# Patient Record
Sex: Female | Born: 1985 | Race: Black or African American | Hispanic: No | Marital: Single | Smoking: Never smoker
Health system: Southern US, Community
[De-identification: ages and names within clinical notes are randomized; demographics above are authoritative.]

## PROBLEM LIST (undated history)

## (undated) DIAGNOSIS — J45909 Unspecified asthma, uncomplicated: Secondary | ICD-10-CM

## (undated) NOTE — ED Provider Notes (Signed)
 Formatting of this note is different from the original. eMERGENCY dEPARTMENT eNCOUnter    CHIEF COMPLAINT   Chief Complaint  Patient presents with  ? Vaginal Bleeding    requesting HCG levels to be checked; [redacted] weeks pregnant, PT states was seen at [facility] initially for vaginal bleeding last Thursday.  Wants to know if she is still pregnant,    HPI   Diana Jennings is a 34 y.o. female who presents to the Emergency Department History of pregnancy and has been having vaginal bleeding since last Thursday. Patient was seen and no vomit several times for the same and was told that she needed a D and C but she just felt uncomfortable receiving this procedure because it had not been explained to her very well what was going on. She reports bleeding, heavier over the week with some clots. Her last period was about 11 weeks ago.  PAST MEDICAL HISTORY   Past Medical History:  Diagnosis Date  ? Asthma    illness related   SURGICAL HISTORY   Past Surgical History:  Procedure Laterality Date  ? MOUTH SURGERY     CURRENT MEDICATIONS   No current facility-administered medications for this encounter.    No current outpatient prescriptions on file.   ALLERGIES   Allergies  Allergen Reactions  ? Benadryl [Diphenhydramine Hcl] Shortness Of Breath    Asthma flares   FAMILY HISTORY   No family history on file.  SOCIAL HISTORY   Social History   Social History  ? Marital status: Single    Spouse name: N/A  ? Number of children: N/A  ? Years of education: N/A   Social History Main Topics  ? Smoking status: Never Smoker  ? Smokeless tobacco: Not on file  ? Alcohol use No  ? Drug use: No  ? Sexual activity: Not on file   Other Topics Concern  ? Not on file   Social History Narrative  ? No narrative on file   REVIEW OF SYSTEMS   Constitutional:  Denies fever, no chills, no weakness.   Eyes:  No visual complaints HENT:  Denies sore throat, no ear pain.   Respiratory:  Denies  cough, no shortness of breath.   Cardiovascular:  Denies chest pain, no palpitations GI:  Denies abdominal pain, no nausea, no vomiting, no diarrhea.   GU: Denies any urinary complaints; vaginal bleeding Musculoskeletal:  Denies pain or swelling Back:  Denies pain.   Neck: Denies pain Skin:  Denies rash.   Neurologic:  Denies headache, no focal weakness  See HPI for further details. A complete review of systems has been obtained and is included in the HPI as above and as per nursing notes. 10 systems have been reviewed and are otherwise negative.  PHYSICAL EXAM    VITAL SIGNS: BP 127/74 (BP Location: Left upper arm, Patient Position: Supine)   Pulse 78   Temp 98.6 F (37 C) (Oral)   Resp 18   Ht 5' 3 (1.6 m)   Wt 180 lb (81.6 kg)   LMP 06/12/2016   SpO2 100%   BMI 31.89 kg/m  Constitutional:  Well appearing no distress Head : No appearance of trauma, no raccoon or Battle sign HENT:  Grossly normal with patent airway, no rhinorrhea Eyes:  PERRL, EOMI, conjunctiva normal, no discharge. Neck: Supple painless ROM, trachea midline;  Respiratory:  breath sounds normal, no resp distress Cardiovascular:  heart sounds nl, regular rate and rhythm. Back:  Non tender, normal painless  ROM         GI:   Soft, non-tender, no distention, normal active bowel sounds Musculoskeletal:  atraumatic, no pedal edema, nml ROM, nml color/temp Skin:  intact, warm dry, no rash Neurologic:  alert and oriented x 3, CN?s nml as tested, sensation nml, motor nml, mood/affect nml  RADIOLOGY   Reviewed by me. See official radiology interpretation. Ultrasound Ob < 14 Weeks Single Or First Gest  Result Date: 07/06/2016 FIRST TRIMESTER OBSTETRICAL ULTRASOUND: COMPARISON: TECHNIQUE:  Standard transabdominal imaging was performed as per the standard pelvic imaging protocol. FINDINGS: Uterus measures 7.6 x 4.9 x 4.1 cm There is no intrauterine gestational sac. The endometrial stripe measures 0.8 cm Right ovary  measures 3.4 x 2.2 x 1.1 cm Left ovary measures 3.0 x 2.2 x 1.7 cm. Normal ovarian blood flow bilaterally 1.7 x 1.5 cm cyst left ovary Within the left adnexa adjacent to the ovary there is a 2.0 x 1.8 cm mass. No well-defined gestational sac.   IMPRESSION: NO INTRAUTERINE GESTATIONAL SAC 2.0 X 1.8 CM MASS ADJACENT TO THE LEFT OVARY WITHOUT A WELL-DEFINED GESTATIONAL SAC. THIS COULD REPRESENT A TUBAL ECTOPIC. CONTINUED FOLLOW-UP IS NEEDED Dictated By: Ozell Perry, MD 07/06/2016 7:47 PM  Electronically Signed by: Ozell Perry, MD 07/06/2016 7:51 PM  Labs Reviewed  HCG, BETA, UR, QUAL - Abnormal; Notable for the following:       Result Value   Pregnancy Urine Positive (*)    All other components within normal limits  URINALYSIS, REFLEX WITH SCOPE - Abnormal; Notable for the following:    Blood, UA Moderate (*)    Urine Urobilinogen 2.0 (*)    Bacteria Rare (*)    Mucus Rare (*)    All other components within normal limits   Narrative:    Microscopic indicated based upon the Positive UA results.  HUMAN CHORIONIC GONADOTROPIN, QUANTITATIVE - Abnormal; Notable for the following:    HCGB, Quantitative 487.0 (*)    All other components within normal limits  ABO GROUPING AND RH TYPING  ABO GROUPING AND RH TYPING   ED COURSE & MEDICAL DECISION MAKING   Pertinent labs & imaging studies reviewed. (See chart for details) Patient with possible ectopic pregnancy. I will talk to her OB/GYN regarding her care.  I did discuss with OB/GYN and they agreed to evaluate this patient however the patient became impatient and at one point decided she was going to leave AGAINST MEDICAL ADVICE. She stated that she had to work in the morning and she could not be her all day. I talked to her about our concerns stating that at ectopic pregnancy could potentially be dangerous and cause internal bleeding and death but she did not want to stay. I will note I reviewed extensive records regarding this patient with her  prior evaluations at [facility] prior ultrasounds noting no prior ovarian masses with the same pregnancy. I also note that they never saw anything to suggest an intrauterine pregnancy either. Her Quant today is the 10th of what it had been before.  At the time of discharge she left with the understanding she is to follow-up with OB/GYN and I did tell her the symptoms of concern that I would have including lightheadedness weakness to suggest internal bleeding.  FINAL IMPRESSION   Final diagnoses:  Left tubal pregnancy without intrauterine pregnancy   Portions of this note may be dictated using Dragon Naturally Speaking voice recognition software. Variances in spelling and vocabulary are possible and unintentional. Not all errors  are caught/corrected. Please notify the dino if any discrepancies are noted or if the meaning of any statement is not clear.    Todd C. Juli, MD 07/07/16 [id number]  Electronically signed by Krystal BROCKS. Juli, MD at 07/07/2016  1:18 AM EST

---

## 2013-12-23 ENCOUNTER — Emergency Department (HOSPITAL_COMMUNITY)
Admission: EM | Admit: 2013-12-23 | Discharge: 2013-12-23 | Discharge status: Against Medical Advice | Payer: Self-pay | Attending: Emergency Medicine | Admitting: Emergency Medicine

## 2013-12-23 ENCOUNTER — Emergency Department (HOSPITAL_COMMUNITY): Payer: Self-pay

## 2013-12-23 ENCOUNTER — Encounter (HOSPITAL_COMMUNITY): Payer: Self-pay | Admitting: Emergency Medicine

## 2013-12-23 DIAGNOSIS — R55 Syncope and collapse: Secondary | ICD-10-CM | POA: Insufficient documentation

## 2013-12-23 DIAGNOSIS — J45909 Unspecified asthma, uncomplicated: Secondary | ICD-10-CM | POA: Insufficient documentation

## 2013-12-23 DIAGNOSIS — R519 Headache, unspecified: Secondary | ICD-10-CM

## 2013-12-23 DIAGNOSIS — R51 Headache: Secondary | ICD-10-CM | POA: Insufficient documentation

## 2013-12-23 DIAGNOSIS — H538 Other visual disturbances: Secondary | ICD-10-CM | POA: Insufficient documentation

## 2013-12-23 HISTORY — DX: Unspecified asthma, uncomplicated: J45.909

## 2013-12-23 LAB — CBC WITH DIFFERENTIAL/PLATELET
BASOS ABS: 0 10*3/uL (ref 0.0–0.1)
BASOS PCT: 0 % (ref 0–1)
EOS ABS: 0.1 10*3/uL (ref 0.0–0.7)
Eosinophils Relative: 2 % (ref 0–5)
HEMATOCRIT: 41.1 % (ref 36.0–46.0)
HEMOGLOBIN: 14.4 g/dL (ref 12.0–15.0)
Lymphocytes Relative: 36 % (ref 12–46)
Lymphs Abs: 2 10*3/uL (ref 0.7–4.0)
MCH: 35.1 pg — AB (ref 26.0–34.0)
MCHC: 35 g/dL (ref 30.0–36.0)
MCV: 100.2 fL — ABNORMAL HIGH (ref 78.0–100.0)
MONO ABS: 0.4 10*3/uL (ref 0.1–1.0)
MONOS PCT: 8 % (ref 3–12)
Neutro Abs: 3.1 10*3/uL (ref 1.7–7.7)
Neutrophils Relative %: 54 % (ref 43–77)
Platelets: 334 10*3/uL (ref 150–400)
RBC: 4.1 MIL/uL (ref 3.87–5.11)
RDW: 12.1 % (ref 11.5–15.5)
WBC: 5.6 10*3/uL (ref 4.0–10.5)

## 2013-12-23 LAB — COMPREHENSIVE METABOLIC PANEL
ALBUMIN: 4.3 g/dL (ref 3.5–5.2)
ALT: 15 U/L (ref 0–35)
AST: 22 U/L (ref 0–37)
Alkaline Phosphatase: 50 U/L (ref 39–117)
BUN: 5 mg/dL — AB (ref 6–23)
CO2: 25 mEq/L (ref 19–32)
CREATININE: 0.72 mg/dL (ref 0.50–1.10)
Calcium: 9.5 mg/dL (ref 8.4–10.5)
Chloride: 101 mEq/L (ref 96–112)
GFR calc Af Amer: >90 mL/min (ref >90)
GFR calc non Af Amer: >90 mL/min (ref >90)
Glucose, Bld: 90 mg/dL (ref 70–99)
Potassium: 3.7 mEq/L (ref 3.7–5.3)
Sodium: 141 mEq/L (ref 137–147)
TOTAL PROTEIN: 7.2 g/dL (ref 6.0–8.3)
Total Bilirubin: 0.5 mg/dL (ref 0.3–1.2)

## 2013-12-23 LAB — URINALYSIS, ROUTINE W REFLEX MICROSCOPIC
Bilirubin Urine: NEGATIVE
GLUCOSE, UA: NEGATIVE mg/dL
Hgb urine dipstick: NEGATIVE
Ketones, ur: >80 mg/dL — AB
Leukocytes, UA: NEGATIVE
Nitrite: POSITIVE — AB
PH: 6.5 (ref 5.0–8.0)
Protein, ur: NEGATIVE mg/dL
Specific Gravity, Urine: 1.017 (ref 1.005–1.030)
Urobilinogen, UA: 1 mg/dL (ref 0.0–1.0)

## 2013-12-23 LAB — URINE MICROSCOPIC-ADD ON

## 2013-12-23 LAB — PREGNANCY, URINE: PREG TEST UR: NEGATIVE

## 2013-12-23 MED ORDER — SODIUM CHLORIDE 0.9 % IV BOLUS (SEPSIS)
1000.0000 mL | Freq: Once | INTRAVENOUS | Status: AC
  Administered 2013-12-23: 1000 mL via INTRAVENOUS

## 2013-12-23 MED ORDER — PROCHLORPERAZINE EDISYLATE 5 MG/ML IJ SOLN
10.0000 mg | Freq: Once | INTRAMUSCULAR | Status: AC
  Administered 2013-12-23: 10 mg via INTRAVENOUS
  Filled 2013-12-23: qty 2

## 2013-12-23 MED ORDER — DEXTROSE 5 % IV SOLN
1.0000 g | Freq: Once | INTRAVENOUS | Status: DC
  Filled 2013-12-23: qty 10

## 2013-12-23 NOTE — ED Notes (Signed)
Pt c/o blurred vision that started yesterday and then today "I woke up on the floor". Pt denies knowing why or how she got on the floor. Pt states that she always has headaches.

## 2013-12-23 NOTE — ED Notes (Signed)
Patient transported to CT 

## 2013-12-23 NOTE — ED Notes (Signed)
Pt had orders for IV Rocephin. Pt did not receive medication because she left AMA.
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
## 2013-12-23 NOTE — ED Notes (Signed)
Pt is aware of the need for urine, however states that she is unable to provide one at this time.  
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
## 2013-12-24 NOTE — ED Provider Notes (Signed)
Medical screening examination/treatment/procedure(s) were performed by non-physician practitioner and as supervising physician I was immediately available for consultation/collaboration.   EKG Interpretation None        Rolland Porter, MD 12/24/13 1555

## 2014-01-21 ENCOUNTER — Encounter (HOSPITAL_COMMUNITY): Payer: Self-pay | Admitting: Emergency Medicine

## 2014-01-21 ENCOUNTER — Emergency Department (HOSPITAL_COMMUNITY)
Admission: EM | Admit: 2014-01-21 | Discharge: 2014-01-21 | Disposition: A | Discharge status: Home / Self Care | Payer: Self-pay | Attending: Emergency Medicine | Admitting: Emergency Medicine

## 2014-01-21 DIAGNOSIS — N898 Other specified noninflammatory disorders of vagina: Secondary | ICD-10-CM | POA: Insufficient documentation

## 2014-01-21 DIAGNOSIS — Z3202 Encounter for pregnancy test, result negative: Secondary | ICD-10-CM | POA: Insufficient documentation

## 2014-01-21 DIAGNOSIS — J45909 Unspecified asthma, uncomplicated: Secondary | ICD-10-CM | POA: Insufficient documentation

## 2014-01-21 DIAGNOSIS — G8929 Other chronic pain: Secondary | ICD-10-CM | POA: Insufficient documentation

## 2014-01-21 LAB — URINALYSIS, ROUTINE W REFLEX MICROSCOPIC
Bilirubin Urine: NEGATIVE
Glucose, UA: NEGATIVE mg/dL
HGB URINE DIPSTICK: NEGATIVE
KETONES UR: NEGATIVE mg/dL
Nitrite: NEGATIVE
PH: 5.5 (ref 5.0–8.0)
Protein, ur: NEGATIVE mg/dL
Specific Gravity, Urine: 1.021 (ref 1.005–1.030)
Urobilinogen, UA: 1 mg/dL (ref 0.0–1.0)

## 2014-01-21 LAB — POC URINE PREG, ED: Preg Test, Ur: NEGATIVE

## 2014-01-21 LAB — URINE MICROSCOPIC-ADD ON

## 2014-01-21 LAB — WET PREP, GENITAL
Clue Cells Wet Prep HPF POC: NONE SEEN
TRICH WET PREP: NONE SEEN
YEAST WET PREP: NONE SEEN

## 2014-01-21 MED ORDER — CEFTRIAXONE SODIUM 250 MG IJ SOLR
250.0000 mg | Freq: Once | INTRAMUSCULAR | Status: AC
  Administered 2014-01-21: 250 mg via INTRAMUSCULAR
  Filled 2014-01-21: qty 250

## 2014-01-21 MED ORDER — IBUPROFEN 800 MG PO TABS: 800.0000 mg | ORAL_TABLET | Freq: Three times a day (TID) | ORAL | Status: DC

## 2014-01-21 MED ORDER — LIDOCAINE HCL 1 % IJ SOLN
INTRAMUSCULAR | Status: AC
  Administered 2014-01-21: 20 mL
  Filled 2014-01-21: qty 20

## 2014-01-21 MED ORDER — IBUPROFEN 800 MG PO TABS
800.0000 mg | ORAL_TABLET | Freq: Once | ORAL | Status: AC
  Administered 2014-01-21: 800 mg via ORAL
  Filled 2014-01-21: qty 1

## 2014-01-21 MED ORDER — AZITHROMYCIN 250 MG PO TABS
1000.0000 mg | ORAL_TABLET | Freq: Once | ORAL | Status: AC
  Administered 2014-01-21: 1000 mg via ORAL
  Filled 2014-01-21: qty 4

## 2014-01-21 NOTE — Discharge Instructions (Signed)
FOLLOW UP WITH YOUR DOCTOR FOR FURTHER EVALUATION IF PAIN CONTINUES. RETURN HERE IF SYMPTOMS WORSEN.

## 2014-01-21 NOTE — ED Provider Notes (Signed)
CSN: [id number]     Arrival date & time 01/21/14  0213 History   First MD Initiated Contact with Patient 01/21/14 0600     Chief Complaint  Patient presents with  . Vaginal Pain     (Consider location/radiation/quality/duration/timing/severity/associated sxs/prior Treatment) Patient is a 28 y.o. female presenting with vaginal pain. The history is provided by the patient. No language interpreter was used.  Vaginal Pain Pertinent negatives include no chills or fever. Associated symptoms comments: She complains of left pelvic pain for the past 3 days. She reports a history of chronic pelvic pain that has been evaluated by her gynecologist, she states, by ultrasound and lab studies without answer. She currently has vaginal discharge without fever, nausea or vomiting. No dysuria, abnormal vaginal bleeding, or change in bowel habits..    Past Medical History  Diagnosis Date  . Asthma    History reviewed. No pertinent past surgical history. History reviewed. No pertinent family history. History  Substance Use Topics  . Smoking status: Never Smoker   . Smokeless tobacco: Never Used  . Alcohol Use: Yes     Comment: occ   OB History   Grav Para Term Preterm Abortions TAB SAB Ect Mult Living                 Review of Systems  Constitutional: Negative for fever and chills.  Gastrointestinal: Negative.   Genitourinary: Positive for vaginal discharge and pelvic pain. Negative for dysuria, vaginal bleeding and menstrual problem.  Musculoskeletal: Negative.   Skin: Negative.   Neurological: Negative.       Allergies  Benadryl  Home Medications   Prior to Admission medications   Medication Sig Start Date End Date Taking? Authorizing Maisha Bogen  ibuprofen (ADVIL,MOTRIN) 200 MG tablet Take 400 mg by mouth every 6 (six) hours as needed for moderate pain.   Yes Historical Norvil Martensen, MD   BP 122/74  Pulse 77  Temp(Src) 98.2 F (36.8 C) (Oral)  Resp 18  Ht 5\' 3"  (1.6 m)  Wt 190 lb  (86.183 kg)  BMI 33.67 kg/m2  SpO2 100%  LMP 01/05/2014 Physical Exam  Constitutional: She is oriented to person, place, and time. She appears well-developed and well-nourished.  HENT:  Head: Normocephalic.  Neck: Normal range of motion. Neck supple.  Cardiovascular: Normal rate and regular rhythm.   Pulmonary/Chest: Effort normal and breath sounds normal.  Abdominal: Soft. Bowel sounds are normal. There is no tenderness. There is no rebound and no guarding.  Genitourinary: Uterus normal. Vaginal discharge found.  No adnexal mass or tenderness. No CMT, pelvic mass or right adnexal tenderness or mass. Left adnexal tenderness without mass.   Musculoskeletal: Normal range of motion.  Neurological: She is alert and oriented to person, place, and time.  Skin: Skin is warm and dry. No rash noted.  Psychiatric: She has a normal mood and affect.    ED Course  Procedures (including critical care time) Labs Review Labs Reviewed  WET PREP, GENITAL - Abnormal; Notable for the following:    WBC, Wet Prep HPF POC MANY (*)    All other components within normal limits  URINALYSIS, ROUTINE W REFLEX MICROSCOPIC - Abnormal; Notable for the following:    Leukocytes, UA SMALL (*)    All other components within normal limits  GC/CHLAMYDIA PROBE AMP  URINE MICROSCOPIC-ADD ON  POC URINE PREG, ED    Imaging Review No results found.   EKG Interpretation None      MDM   Final diagnoses:  None    1. Pelvic pain  No fever or significant pelvic tenderness in the setting of chronic pelvic pain and new discharge. Treated with abx, pain medication. Doubt TOA. No CMT - doubt PID. Refer to GYN for recheck and further evaluation as needed.     Arnoldo HookerShari A Upstill, PA-C 01/21/14 [phone]

## 2014-01-21 NOTE — ED Notes (Signed)
Pt states she is having pain she describes as cervical pain on the left side  Pt states she has been hurting for the past 3 days  Pt states last time she was here about a month ago she was told she had a UTI but was not given any medication for it

## 2014-01-21 NOTE — ED Provider Notes (Signed)
Medical screening examination/treatment/procedure(s) were performed by non-physician practitioner and as supervising physician I was immediately available for consultation/collaboration.   EKG Interpretation None       Ethelda ChickMartha K Linker, MD 01/21/14 [phone]

## 2014-01-22 LAB — GC/CHLAMYDIA PROBE AMP
CT PROBE, AMP APTIMA: NEGATIVE
GC Probe RNA: NEGATIVE

## 2014-07-30 ENCOUNTER — Emergency Department (HOSPITAL_COMMUNITY): Payer: Self-pay

## 2014-07-30 ENCOUNTER — Emergency Department (HOSPITAL_COMMUNITY)
Admission: EM | Admit: 2014-07-30 | Discharge: 2014-07-30 | Disposition: A | Discharge status: Home / Self Care | Payer: Self-pay | Attending: Emergency Medicine | Admitting: Emergency Medicine

## 2014-07-30 ENCOUNTER — Emergency Department (HOSPITAL_COMMUNITY)
Admission: EM | Admit: 2014-07-30 | Discharge: 2014-07-30 | Discharge status: Against Medical Advice | Payer: Self-pay | Attending: Emergency Medicine | Admitting: Emergency Medicine

## 2014-07-30 ENCOUNTER — Encounter (HOSPITAL_COMMUNITY): Payer: Self-pay | Admitting: *Deleted

## 2014-07-30 DIAGNOSIS — Y9289 Other specified places as the place of occurrence of the external cause: Secondary | ICD-10-CM | POA: Insufficient documentation

## 2014-07-30 DIAGNOSIS — Y998 Other external cause status: Secondary | ICD-10-CM | POA: Insufficient documentation

## 2014-07-30 DIAGNOSIS — Z792 Long term (current) use of antibiotics: Secondary | ICD-10-CM | POA: Insufficient documentation

## 2014-07-30 DIAGNOSIS — S0121XA Laceration without foreign body of nose, initial encounter: Secondary | ICD-10-CM | POA: Insufficient documentation

## 2014-07-30 DIAGNOSIS — J45909 Unspecified asthma, uncomplicated: Secondary | ICD-10-CM | POA: Insufficient documentation

## 2014-07-30 DIAGNOSIS — Y9389 Activity, other specified: Secondary | ICD-10-CM | POA: Insufficient documentation

## 2014-07-30 DIAGNOSIS — S022XXB Fracture of nasal bones, initial encounter for open fracture: Secondary | ICD-10-CM | POA: Insufficient documentation

## 2014-07-30 DIAGNOSIS — IMO0002 Reserved for concepts with insufficient information to code with codable children: Secondary | ICD-10-CM

## 2014-07-30 MED ORDER — CLINDAMYCIN HCL 150 MG PO CAPS: 150.0000 mg | ORAL_CAPSULE | Freq: Four times a day (QID) | ORAL | Status: AC

## 2014-07-30 MED ORDER — OXYCODONE-ACETAMINOPHEN 5-325 MG PO TABS: 2.0000 | ORAL_TABLET | ORAL | Status: AC | PRN
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
## 2014-07-30 NOTE — ED Notes (Signed)
Bed: WA22 Expected date:  Expected time:  Means of arrival:  Comments: 
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
## 2014-09-25 ENCOUNTER — Emergency Department (HOSPITAL_COMMUNITY)
Admission: EM | Admit: 2014-09-25 | Discharge: 2014-09-25 | Disposition: A | Discharge status: Home / Self Care | Payer: Self-pay | Attending: Emergency Medicine | Admitting: Emergency Medicine

## 2014-09-25 ENCOUNTER — Encounter (HOSPITAL_COMMUNITY): Payer: Self-pay | Admitting: Emergency Medicine

## 2014-09-25 ENCOUNTER — Emergency Department (HOSPITAL_COMMUNITY): Payer: Self-pay

## 2014-09-25 DIAGNOSIS — T07XXXA Unspecified multiple injuries, initial encounter: Secondary | ICD-10-CM

## 2014-09-25 DIAGNOSIS — S6992XA Unspecified injury of left wrist, hand and finger(s), initial encounter: Secondary | ICD-10-CM | POA: Insufficient documentation

## 2014-09-25 DIAGNOSIS — Z791 Long term (current) use of non-steroidal anti-inflammatories (NSAID): Secondary | ICD-10-CM | POA: Insufficient documentation

## 2014-09-25 DIAGNOSIS — S4991XA Unspecified injury of right shoulder and upper arm, initial encounter: Secondary | ICD-10-CM | POA: Insufficient documentation

## 2014-09-25 DIAGNOSIS — Y999 Unspecified external cause status: Secondary | ICD-10-CM | POA: Insufficient documentation

## 2014-09-25 DIAGNOSIS — J45909 Unspecified asthma, uncomplicated: Secondary | ICD-10-CM | POA: Insufficient documentation

## 2014-09-25 DIAGNOSIS — Y939 Activity, unspecified: Secondary | ICD-10-CM | POA: Insufficient documentation

## 2014-09-25 DIAGNOSIS — S0081XA Abrasion of other part of head, initial encounter: Secondary | ICD-10-CM | POA: Insufficient documentation

## 2014-09-25 DIAGNOSIS — S0083XA Contusion of other part of head, initial encounter: Secondary | ICD-10-CM | POA: Insufficient documentation

## 2014-09-25 DIAGNOSIS — Y929 Unspecified place or not applicable: Secondary | ICD-10-CM | POA: Insufficient documentation

## 2014-09-25 MED ORDER — HYDROCODONE-ACETAMINOPHEN 5-325 MG PO TABS: 1.0000 | ORAL_TABLET | Freq: Four times a day (QID) | ORAL | Status: AC | PRN

## 2014-09-25 MED ORDER — IBUPROFEN 800 MG PO TABS: 800.0000 mg | ORAL_TABLET | Freq: Three times a day (TID) | ORAL | Status: AC | PRN

## 2014-09-25 MED ORDER — HYDROCODONE-ACETAMINOPHEN 5-325 MG PO TABS
2.0000 | ORAL_TABLET | Freq: Once | ORAL | Status: AC
  Administered 2014-09-25: 2 via ORAL
  Filled 2014-09-25: qty 2

## 2014-09-25 NOTE — ED Notes (Signed)
Patient transported to CT 

## 2014-09-25 NOTE — ED Notes (Signed)
Patient transported to X-ray 
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
## 2014-09-25 NOTE — Discharge Instructions (Signed)
Use ice and heat on the areas that are sore.  Follow-up with Dr. provided.  Return here as needed
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
## 2014-09-25 NOTE — ED Notes (Signed)
Patient returned from CT

## 2014-10-01 ENCOUNTER — Encounter (HOSPITAL_COMMUNITY): Payer: Self-pay | Admitting: Emergency Medicine

## 2014-10-01 ENCOUNTER — Emergency Department (HOSPITAL_COMMUNITY)
Admission: EM | Admit: 2014-10-01 | Discharge: 2014-10-01 | Disposition: A | Discharge status: Home / Self Care | Payer: Medicaid Other | Attending: Emergency Medicine | Admitting: Emergency Medicine

## 2014-10-01 DIAGNOSIS — X58XXXA Exposure to other specified factors, initial encounter: Secondary | ICD-10-CM | POA: Insufficient documentation

## 2014-10-01 DIAGNOSIS — J45909 Unspecified asthma, uncomplicated: Secondary | ICD-10-CM | POA: Diagnosis not present

## 2014-10-01 DIAGNOSIS — Y999 Unspecified external cause status: Secondary | ICD-10-CM | POA: Insufficient documentation

## 2014-10-01 DIAGNOSIS — S39013A Strain of muscle, fascia and tendon of pelvis, initial encounter: Secondary | ICD-10-CM

## 2014-10-01 DIAGNOSIS — Y929 Unspecified place or not applicable: Secondary | ICD-10-CM | POA: Diagnosis not present

## 2014-10-01 DIAGNOSIS — S3141XA Laceration without foreign body of vagina and vulva, initial encounter: Secondary | ICD-10-CM | POA: Insufficient documentation

## 2014-10-01 DIAGNOSIS — Z3202 Encounter for pregnancy test, result negative: Secondary | ICD-10-CM | POA: Diagnosis not present

## 2014-10-01 DIAGNOSIS — L293 Anogenital pruritus, unspecified: Secondary | ICD-10-CM | POA: Diagnosis present

## 2014-10-01 DIAGNOSIS — Y939 Activity, unspecified: Secondary | ICD-10-CM | POA: Insufficient documentation

## 2014-10-01 LAB — GC/CHLAMYDIA PROBE AMP (~~LOC~~) NOT AT ARMC
CHLAMYDIA, DNA PROBE: NEGATIVE
Neisseria Gonorrhea: NEGATIVE

## 2014-10-01 LAB — PREGNANCY, URINE: Preg Test, Ur: NEGATIVE

## 2014-10-01 LAB — URINALYSIS, ROUTINE W REFLEX MICROSCOPIC
BILIRUBIN URINE: NEGATIVE
Glucose, UA: NEGATIVE mg/dL
HGB URINE DIPSTICK: NEGATIVE
Ketones, ur: NEGATIVE mg/dL
Leukocytes, UA: NEGATIVE
NITRITE: NEGATIVE
PROTEIN: NEGATIVE mg/dL
Specific Gravity, Urine: 1.034 — ABNORMAL HIGH (ref 1.005–1.030)
Urobilinogen, UA: 1 mg/dL (ref 0.0–1.0)
pH: 5.5 (ref 5.0–8.0)

## 2014-10-01 LAB — WET PREP, GENITAL
Trich, Wet Prep: NONE SEEN
Yeast Wet Prep HPF POC: NONE SEEN

## 2014-10-01 MED ORDER — VALACYCLOVIR HCL 500 MG PO TABS
1000.0000 mg | ORAL_TABLET | Freq: Once | ORAL | Status: AC
  Administered 2014-10-01: 1000 mg via ORAL
  Filled 2014-10-01: qty 2

## 2014-10-01 MED ORDER — VALACYCLOVIR HCL 1 G PO TABS: 1000.0000 mg | ORAL_TABLET | Freq: Two times a day (BID) | ORAL | Status: AC

## 2014-10-01 NOTE — ED Notes (Signed)
Pt noticed itching and tears to vaginal area starting yesterday after sex, states issue worsened tonight even with use of lubricant.
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
